# Patient Record
Sex: Male | Born: 1985 | Hispanic: No | Marital: Married | Smoking: Never smoker
Health system: Southern US, Community
[De-identification: ages and names within clinical notes are randomized; demographics above are authoritative.]

---

## 2019-06-26 ENCOUNTER — Encounter (HOSPITAL_BASED_OUTPATIENT_CLINIC_OR_DEPARTMENT_OTHER): Payer: Self-pay

## 2019-06-26 ENCOUNTER — Emergency Department (HOSPITAL_BASED_OUTPATIENT_CLINIC_OR_DEPARTMENT_OTHER)
Admission: EM | Admit: 2019-06-26 | Discharge: 2019-06-26 | Disposition: A | Discharge status: Home / Self Care | Payer: Managed Care, Other (non HMO) | Attending: Emergency Medicine | Admitting: Emergency Medicine

## 2019-06-26 ENCOUNTER — Other Ambulatory Visit: Payer: Self-pay

## 2019-06-26 ENCOUNTER — Emergency Department (HOSPITAL_BASED_OUTPATIENT_CLINIC_OR_DEPARTMENT_OTHER): Payer: Managed Care, Other (non HMO)

## 2019-06-26 DIAGNOSIS — Y999 Unspecified external cause status: Secondary | ICD-10-CM | POA: Diagnosis not present

## 2019-06-26 DIAGNOSIS — Y9389 Activity, other specified: Secondary | ICD-10-CM | POA: Diagnosis not present

## 2019-06-26 DIAGNOSIS — S29012A Strain of muscle and tendon of back wall of thorax, initial encounter: Secondary | ICD-10-CM | POA: Insufficient documentation

## 2019-06-26 DIAGNOSIS — M549 Dorsalgia, unspecified: Secondary | ICD-10-CM

## 2019-06-26 DIAGNOSIS — Y92410 Unspecified street and highway as the place of occurrence of the external cause: Secondary | ICD-10-CM | POA: Insufficient documentation

## 2019-06-26 DIAGNOSIS — S39012A Strain of muscle, fascia and tendon of lower back, initial encounter: Secondary | ICD-10-CM | POA: Insufficient documentation

## 2019-06-26 DIAGNOSIS — S299XXA Unspecified injury of thorax, initial encounter: Secondary | ICD-10-CM | POA: Diagnosis present

## 2019-06-26 DIAGNOSIS — Z79899 Other long term (current) drug therapy: Secondary | ICD-10-CM | POA: Diagnosis not present
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
## 2019-06-26 NOTE — Discharge Instructions (Signed)
Recommend Tylenol, Motrin as needed for pain control.  Recommend return to ER for chest pain, difficulty in breathing, other new concerning symptom.

## 2020-09-12 IMAGING — DX DG THORACIC SPINE 3V
3 series · 3 of 3 positions shown · non-contrast
Comparison: None.

CLINICAL DATA: Back pain

EXAM:
THORACIC SPINE - 3 VIEWS

[t-spine ap]
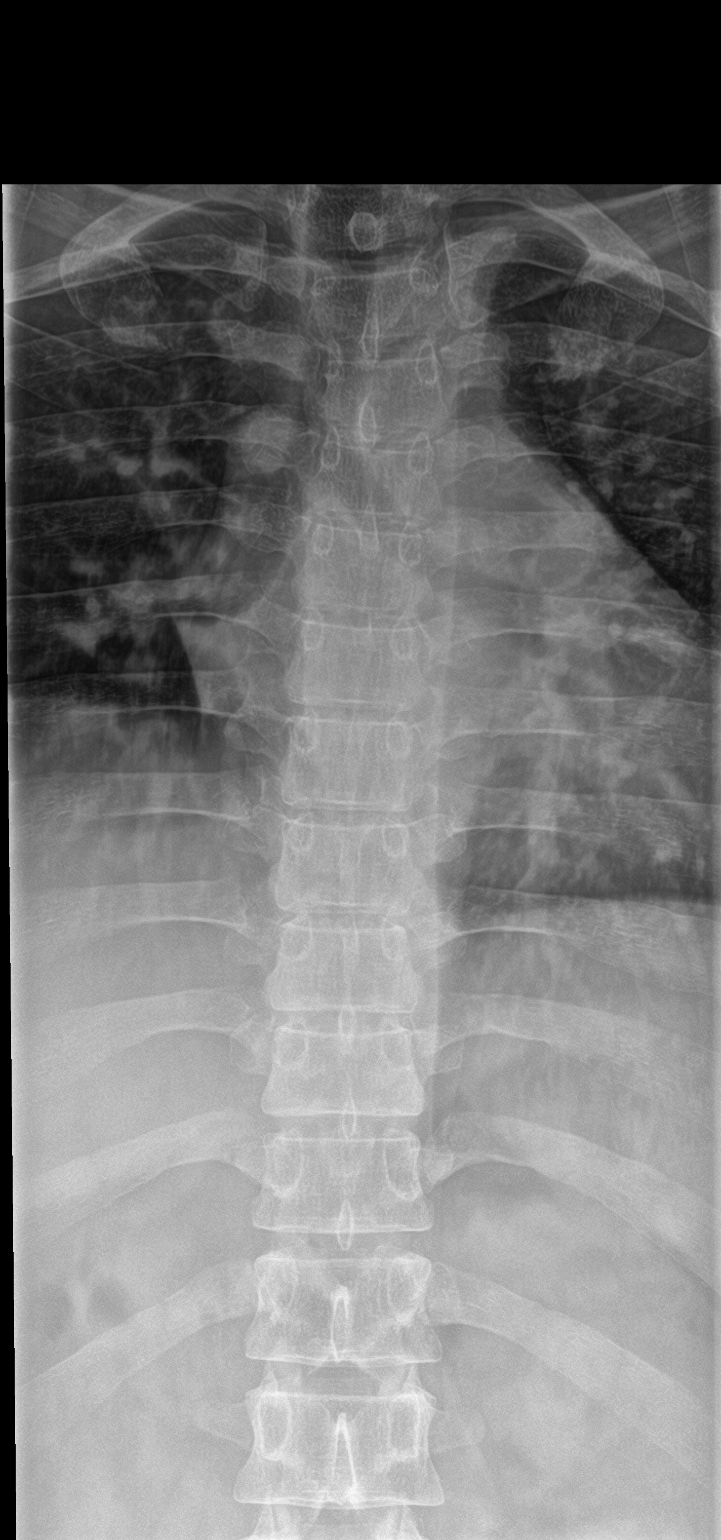

[t-spine lat]
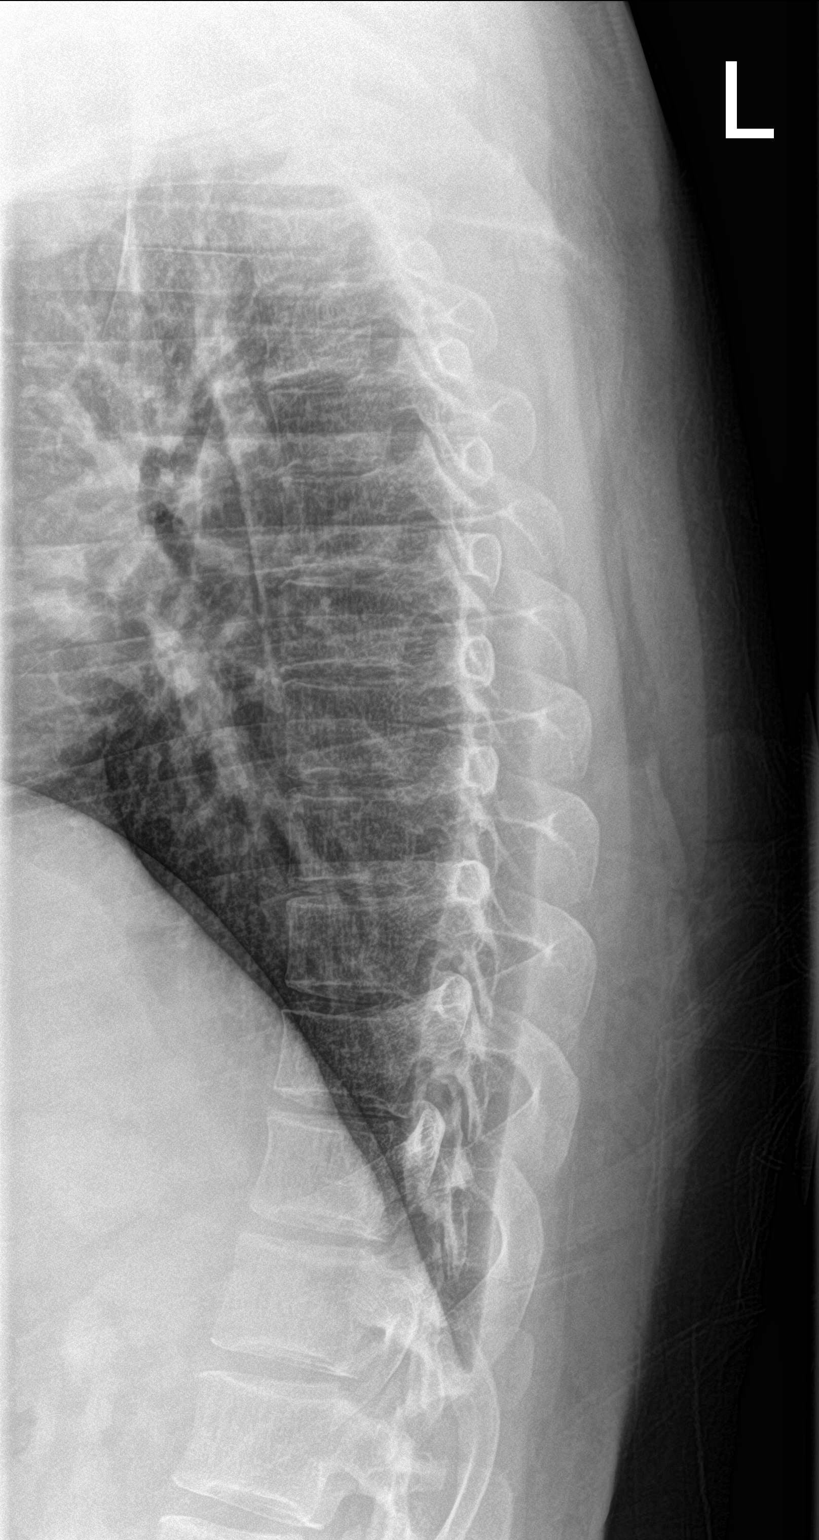

[t-spine swimmers]
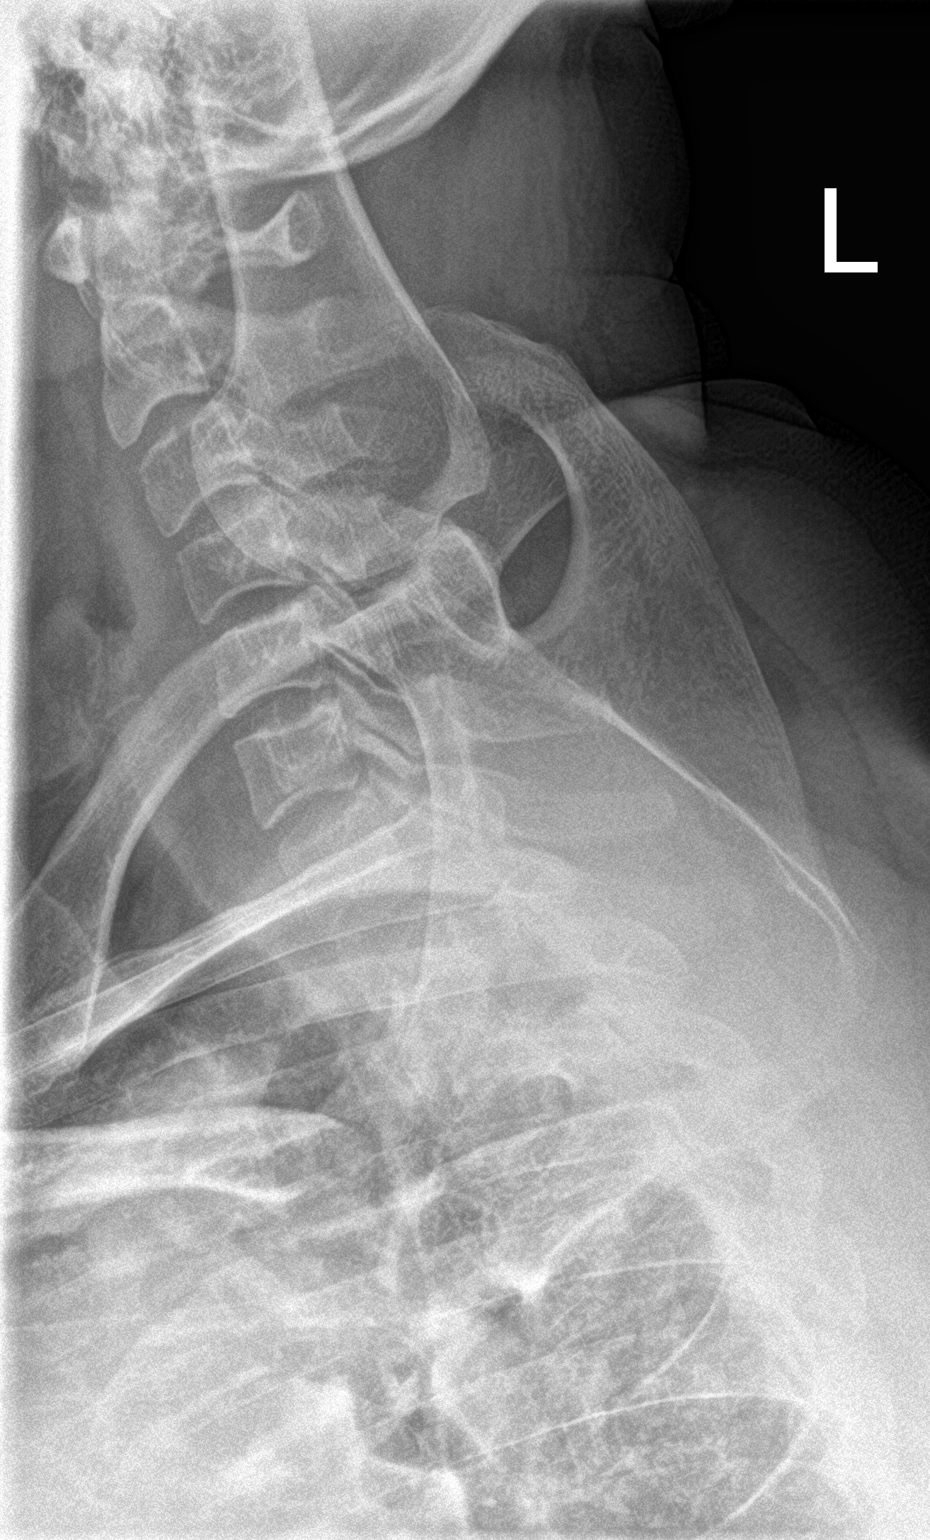

[3 of 3 positions shown; findings below may reference images not displayed]

FINDINGS: There is no evidence of thoracic spine fracture. Alignment is
normal. No other significant bone abnormalities are identified.
IMPRESSION: Negative.
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
